# Patient Record
Sex: Female | Born: 1945
Health system: Southern US, Community
[De-identification: ages and names within clinical notes are randomized; demographics above are authoritative.]

## PROBLEM LIST (undated history)

## (undated) DIAGNOSIS — I10 Essential (primary) hypertension: Secondary | ICD-10-CM

## (undated) DIAGNOSIS — E119 Type 2 diabetes mellitus without complications: Secondary | ICD-10-CM

## (undated) HISTORY — PX: BRAIN TUMOR EXCISION: SHX577

## (undated) HISTORY — PX: ABDOMINAL HYSTERECTOMY: SHX81

---

## 2017-09-22 ENCOUNTER — Emergency Department (HOSPITAL_BASED_OUTPATIENT_CLINIC_OR_DEPARTMENT_OTHER): Payer: Medicare Other

## 2017-09-22 ENCOUNTER — Encounter (HOSPITAL_BASED_OUTPATIENT_CLINIC_OR_DEPARTMENT_OTHER): Payer: Self-pay

## 2017-09-22 ENCOUNTER — Emergency Department (HOSPITAL_BASED_OUTPATIENT_CLINIC_OR_DEPARTMENT_OTHER)
Admission: EM | Admit: 2017-09-22 | Discharge: 2017-09-22 | Disposition: A | Discharge status: Home / Self Care | Payer: Medicare Other | Attending: Physician Assistant | Admitting: Physician Assistant

## 2017-09-22 DIAGNOSIS — E119 Type 2 diabetes mellitus without complications: Secondary | ICD-10-CM | POA: Insufficient documentation

## 2017-09-22 DIAGNOSIS — Z79899 Other long term (current) drug therapy: Secondary | ICD-10-CM | POA: Diagnosis not present

## 2017-09-22 DIAGNOSIS — M25532 Pain in left wrist: Secondary | ICD-10-CM | POA: Insufficient documentation

## 2017-09-22 DIAGNOSIS — Z7982 Long term (current) use of aspirin: Secondary | ICD-10-CM | POA: Diagnosis not present

## 2017-09-22 DIAGNOSIS — I10 Essential (primary) hypertension: Secondary | ICD-10-CM | POA: Insufficient documentation

## 2017-09-22 HISTORY — DX: Essential (primary) hypertension: I10

## 2017-09-22 HISTORY — DX: Type 2 diabetes mellitus without complications: E11.9

## 2017-09-22 MED ORDER — IBUPROFEN 800 MG PO TABS
800.0000 mg | ORAL_TABLET | Freq: Once | ORAL | Status: AC
Start: 2017-09-22 — End: 2017-09-22
  Administered 2017-09-22: 800 mg via ORAL
  Filled 2017-09-22: qty 1

## 2017-09-22 MED ORDER — IBUPROFEN 600 MG PO TABS: 600.0000 mg | ORAL_TABLET | Freq: Four times a day (QID) | ORAL | 0 refills | Status: AC | PRN

## 2017-09-22 MED FILL — IBUPROFEN 600 MG TABLET: 600 | 8 days supply | Qty: 30 | Fill #0

## 2017-09-22 NOTE — ED Triage Notes (Signed)
Pt c/o lt wrist pain x2wks, denies injury, states worse today unable to hold anything

## 2017-09-22 NOTE — Discharge Instructions (Signed)
You were seen today for pain in your left wrist.  Your pain is most likely from arthritis.  You may also have some pain from carpal tunnel syndrome.  We do suggest that you follow-up with an orthopedic physician for further treatment and evaluation.  In the meantime we have given you a wrist splint.  At home take ibuprofen for pain.  Do not use this ibuprofen for longer than 2 weeks.  While you are taking ibuprofen, do not take aspirin.  You can continue Tylenol 3 if needed. Take care!

## 2017-09-22 NOTE — ED Provider Notes (Signed)
[facility] EMERGENCY DEPARTMENT Provider Note   CSN: [id number] Arrival date & time: 09/22/17  0857     History   Chief Complaint Chief Complaint  Patient presents with  . Wrist Pain    HPI Amy Baker is a 72 y.o. female with PMH of T2DM, HTN here for left wrist pain  HPI  Patient is here for left wrist pain for the last 2 weeks.  She denies any trauma to the wrist.  She does note that 2 weeks ago she was lifting a heavy pot of water and while she was lifting it she felt pain on the radial aspect of her left wrist.  She notes that since then the pain has been constant.  She notes that the pain is worse in the morning.  She has been taking her home Tylenol 3 that her PCP prescribes.  She is also been taking aspirin.  She notes that sweeping her floors also worsens her wrist pain.  She admits to some numbness and tingling of the first 3 digits of her left hand.  She does have a history of arthritis in several joints but has never had pain in her left wrist like this before.  Past Medical History:  Diagnosis Date  . Diabetes mellitus without complication (HCC)   . Hypertension     There are no active problems to display for this patient.   Past Surgical History:  Procedure Laterality Date  . ABDOMINAL HYSTERECTOMY    . BRAIN TUMOR EXCISION       OB History   None      Home Medications    Prior to Admission medications   Medication Sig Start Date End Date Taking? Authorizing Provider  acetaminophen-codeine (TYLENOL #3) 300-30 MG tablet Take by mouth every 4 (four) hours as needed for moderate pain.   Yes [provider]  amLODipine (NORVASC) 10 MG tablet Take 10 mg by mouth daily.   Yes [provider]  aspirin EC 81 MG tablet Take 81 mg by mouth daily.   Yes [provider]  atorvastatin (LIPITOR) 20 MG tablet Take 20 mg by mouth daily.   Yes [provider]  carvedilol (COREG) 25 MG tablet Take 25 mg by mouth 2 (two)  times daily with a meal.   Yes [provider]  losartan-hydrochlorothiazide (HYZAAR) 100-25 MG tablet Take 1 tablet by mouth daily.   Yes [provider]  venlafaxine (EFFEXOR) 75 MG tablet Take 75 mg by mouth 2 (two) times daily.   Yes [provider]  ibuprofen (ADVIL,MOTRIN) 600 MG tablet Take 1 tablet (600 mg total) by mouth every 6 (six) hours as needed. 09/22/17   Beaulah DinningGambino, Christina M, MD    Family History No family history on file.  Social History Social History   Tobacco Use  . Smoking status: Never Smoker  . Smokeless tobacco: Never Used  Substance Use Topics  . Alcohol use: Not Currently  . Drug use: Not Currently     Allergies   Patient has no known allergies.   Review of Systems Review of Systems  Musculoskeletal: Positive for arthralgias (Hip and wrist).  Neurological: Positive for numbness.  All other systems reviewed and are negative.    Physical Exam Updated Vital Signs BP (!) 146/105 (BP Location: Right Arm)   Pulse 65   Temp 97.9 F (36.6 C) (Oral)   Resp 18   Ht 5\' 3"  (1.6 m)   Wt 75.3 kg (166 lb)  SpO2 97%   BMI 29.41 kg/m   Physical Exam  Constitutional: She is oriented to person, place, and time. She appears well-developed and well-nourished.  HENT:  Head: Normocephalic.  Eyes: Pupils are equal, round, and reactive to light.  Neck: Normal range of motion.  Cardiovascular: Normal rate and regular rhythm.  Pulmonary/Chest: Effort normal and breath sounds normal.  Abdominal: Soft. Bowel sounds are normal.  Musculoskeletal:       Right wrist: She exhibits normal range of motion, no tenderness, no bony tenderness, no swelling and no deformity.  Left wrist: TTP noted at the 1st MCP joint and in the anatomical snuffbox. Inspection yielded no erythema, ecchymosis, bony deformity, or swelling. ROM full with good flexion and extension and ulnar/radial deviation that is symmetrical with opposite wrist. Does have pain with  ulnar devation. Palpation is normal over scaphoid, lunate, and TFCC; tendons without tenderness/swelling. Strength 5/5 in all directions. Positive tinel's and phalens.   Neurological: She is alert and oriented to person, place, and time.  Skin: Skin is warm and dry. Capillary refill takes less than 2 seconds.  Psychiatric: She has a normal mood and affect.     ED Treatments / Results  Labs (all labs ordered are listed, but only abnormal results are displayed) Labs Reviewed - No data to display  EKG None  Radiology Dg Wrist Complete Left  Result Date: 09/22/2017 CLINICAL DATA:  Pt c/o lt wrist pain x2wks, denies injury, states worse today unable to hold anything EXAM: LEFT WRIST - COMPLETE 3+ VIEW COMPARISON:  None. FINDINGS: There is no evidence of fracture or dislocation. There is no evidence of arthropathy or other focal bone abnormality. Soft tissues are unremarkable. IMPRESSION: Negative. Electronically Signed   By: Corlis Leak M.D.   On: 09/22/2017 09:58    Procedures Procedures (including critical care time)  Medications Ordered in ED Medications  ibuprofen (ADVIL,MOTRIN) tablet 800 mg (800 mg Oral Given 09/22/17 0955)     Initial Impression / Assessment and Plan / ED Course  I have reviewed the triage vital signs and the nursing notes.  Pertinent labs & imaging results that were available during my care of the patient were reviewed by me and considered in my medical decision making (see chart for details).     Patient here for left wrist pain.  Exam consistent with likely arthritis at the MCP joint of the first digit as well as some signs of carpal tunnel syndrome.  X-ray was performed of the left wrist, did not show any acute pathology or signs of fracture.  Patient was given 800 mg of ibuprofen and placed in a wrist splint.  She was instructed to follow-up with the orthopedic specialist.  She will take ibuprofen as needed for pain.  Discussed not to take her usual aspirin  with this.  Stable for discharge at this time.  Final Clinical Impressions(s) / ED Diagnoses   Final diagnoses:  Left wrist pain    ED Discharge Orders        Ordered    ibuprofen (ADVIL,MOTRIN) 600 MG tablet  Every 6 hours PRN     09/22/17 1025       Beaulah Dinning, MD 09/22/17 1111    Abelino Derrick, MD 09/22/17 1125

## 2018-11-19 ENCOUNTER — Other Ambulatory Visit: Payer: Self-pay

## 2018-11-19 DIAGNOSIS — Z20822 Contact with and (suspected) exposure to covid-19: Secondary | ICD-10-CM

## 2018-11-20 LAB — NOVEL CORONAVIRUS, NAA: SARS-CoV-2, NAA: NOT DETECTED

## 2018-11-22 ENCOUNTER — Telehealth: Payer: Self-pay | Admitting: Family Medicine

## 2018-11-22 NOTE — Telephone Encounter (Signed)
Pt aware covid lab test negative, not detected °

## 2019-10-19 ENCOUNTER — Other Ambulatory Visit: Payer: Self-pay

## 2019-10-19 ENCOUNTER — Emergency Department (HOSPITAL_BASED_OUTPATIENT_CLINIC_OR_DEPARTMENT_OTHER)
Admission: EM | Admit: 2019-10-19 | Discharge: 2019-10-19 | Disposition: A | Discharge status: Home / Self Care | Payer: Medicare Other | Attending: Emergency Medicine | Admitting: Emergency Medicine

## 2019-10-19 ENCOUNTER — Emergency Department (HOSPITAL_BASED_OUTPATIENT_CLINIC_OR_DEPARTMENT_OTHER): Payer: Medicare Other

## 2019-10-19 ENCOUNTER — Encounter (HOSPITAL_BASED_OUTPATIENT_CLINIC_OR_DEPARTMENT_OTHER): Payer: Self-pay | Admitting: *Deleted

## 2019-10-19 DIAGNOSIS — I10 Essential (primary) hypertension: Secondary | ICD-10-CM | POA: Diagnosis not present

## 2019-10-19 DIAGNOSIS — E119 Type 2 diabetes mellitus without complications: Secondary | ICD-10-CM | POA: Insufficient documentation

## 2019-10-19 DIAGNOSIS — Y999 Unspecified external cause status: Secondary | ICD-10-CM | POA: Insufficient documentation

## 2019-10-19 DIAGNOSIS — W2203XA Walked into furniture, initial encounter: Secondary | ICD-10-CM | POA: Diagnosis not present

## 2019-10-19 DIAGNOSIS — S90122A Contusion of left lesser toe(s) without damage to nail, initial encounter: Secondary | ICD-10-CM | POA: Diagnosis not present

## 2019-10-19 DIAGNOSIS — Y929 Unspecified place or not applicable: Secondary | ICD-10-CM | POA: Insufficient documentation

## 2019-10-19 DIAGNOSIS — Y9301 Activity, walking, marching and hiking: Secondary | ICD-10-CM | POA: Diagnosis not present

## 2019-10-19 DIAGNOSIS — Z79899 Other long term (current) drug therapy: Secondary | ICD-10-CM | POA: Insufficient documentation

## 2019-10-19 DIAGNOSIS — Z7982 Long term (current) use of aspirin: Secondary | ICD-10-CM | POA: Insufficient documentation

## 2019-10-19 DIAGNOSIS — Z7984 Long term (current) use of oral hypoglycemic drugs: Secondary | ICD-10-CM | POA: Insufficient documentation

## 2019-10-19 DIAGNOSIS — R52 Pain, unspecified: Secondary | ICD-10-CM

## 2019-10-19 DIAGNOSIS — S99922A Unspecified injury of left foot, initial encounter: Secondary | ICD-10-CM | POA: Diagnosis present
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
## 2019-10-19 NOTE — ED Triage Notes (Addendum)
Hit her fourth toe on the kitchen table on 7/17 and has been hurting since then.
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
## 2020-08-20 ENCOUNTER — Emergency Department (HOSPITAL_BASED_OUTPATIENT_CLINIC_OR_DEPARTMENT_OTHER)
Admission: EM | Admit: 2020-08-20 | Discharge: 2020-08-20 | Disposition: A | Discharge status: Home / Self Care | Payer: Medicare Other | Attending: Emergency Medicine | Admitting: Emergency Medicine

## 2020-08-20 ENCOUNTER — Other Ambulatory Visit: Payer: Self-pay

## 2020-08-20 DIAGNOSIS — U071 COVID-19: Secondary | ICD-10-CM | POA: Diagnosis not present

## 2020-08-20 DIAGNOSIS — I10 Essential (primary) hypertension: Secondary | ICD-10-CM | POA: Insufficient documentation

## 2020-08-20 DIAGNOSIS — Z79899 Other long term (current) drug therapy: Secondary | ICD-10-CM | POA: Insufficient documentation

## 2020-08-20 DIAGNOSIS — E119 Type 2 diabetes mellitus without complications: Secondary | ICD-10-CM | POA: Insufficient documentation

## 2020-08-20 DIAGNOSIS — R059 Cough, unspecified: Secondary | ICD-10-CM | POA: Diagnosis present

## 2020-08-20 DIAGNOSIS — Z7982 Long term (current) use of aspirin: Secondary | ICD-10-CM | POA: Diagnosis not present

## 2020-08-20 DIAGNOSIS — Z20822 Contact with and (suspected) exposure to covid-19: Secondary | ICD-10-CM

## 2020-08-20 LAB — SARS CORONAVIRUS 2 BY RT PCR (HOSPITAL ORDER, PERFORMED IN ~~LOC~~ HOSPITAL LAB): SARS Coronavirus 2: POSITIVE — AB

## 2020-08-20 NOTE — ED Provider Notes (Signed)
[facility] EMERGENCY DEPARTMENT Provider Note   CSN: [id number] Arrival date & time: 08/20/20  9518     History Chief Complaint  Patient presents with  . Flu Vaccine    Requesting COVID test     Amy Baker is a 75 y.o. female.  She is here with a mild cough.  Her husband tested positive for COVID.  She is vaccinated.  She has not had any fever nausea vomiting diarrhea.  The history is provided by the patient.  Cough Cough characteristics:  Non-productive Sputum characteristics:  Nondescript Severity:  Mild Onset quality:  Gradual Duration:  1 day Timing:  Sporadic Progression:  Unchanged Chronicity:  New Smoker: no   Context: sick contacts   Relieved by:  None tried Worsened by:  Nothing Ineffective treatments:  None tried Associated symptoms: no chest pain, no fever, no headaches, no rash, no shortness of breath and no sore throat        Past Medical History:  Diagnosis Date  . Diabetes mellitus without complication (HCC)   . Hypertension     There are no problems to display for this patient.   Past Surgical History:  Procedure Laterality Date  . ABDOMINAL HYSTERECTOMY    . BRAIN TUMOR EXCISION       OB History    Gravida  4   Para  3   Term      Preterm      AB  1   Living        SAB      IAB      Ectopic      Multiple      Live Births              No family history on file.  Social History   Tobacco Use  . Smoking status: Never Smoker  . Smokeless tobacco: Never Used  Substance Use Topics  . Alcohol use: Not Currently  . Drug use: Not Currently    Home Medications Prior to Admission medications   Medication Sig Start Date End Date Taking? Authorizing Provider  acetaminophen-codeine (TYLENOL #3) 300-30 MG tablet Take by mouth every 4 (four) hours as needed for moderate pain.    [provider]  amLODipine (NORVASC) 10 MG tablet Take 10 mg by mouth daily.    [provider]  aspirin EC  81 MG tablet Take 81 mg by mouth daily.    [provider]  atorvastatin (LIPITOR) 20 MG tablet Take 20 mg by mouth daily.    [provider]  carvedilol (COREG) 25 MG tablet Take 25 mg by mouth 2 (two) times daily with a meal.    [provider]  gabapentin (NEURONTIN) 300 MG capsule Take 300 mg by mouth 2 (two) times daily.    [provider]  ibuprofen (ADVIL,MOTRIN) 600 MG tablet Take 1 tablet (600 mg total) by mouth every 6 (six) hours as needed. 09/22/17   Beaulah Dinning, MD  losartan-hydrochlorothiazide (HYZAAR) 100-25 MG tablet Take 1 tablet by mouth daily.    [provider]  venlafaxine (EFFEXOR) 75 MG tablet Take 75 mg by mouth 2 (two) times daily.    [provider]    Allergies    Patient has no known allergies.  Review of Systems   Review of Systems  Constitutional: Negative for fever.  HENT: Negative for sore throat.   Eyes: Negative for visual disturbance.  Respiratory: Positive for cough. Negative for shortness of  breath.   Cardiovascular: Negative for chest pain.  Gastrointestinal: Negative for abdominal pain.  Genitourinary: Negative for dysuria.  Musculoskeletal: Negative for neck pain.  Skin: Negative for rash.  Neurological: Negative for headaches.    Physical Exam Updated Vital Signs BP (!) 148/76 (BP Location: Right Arm)   Pulse 70   Temp (!) 97.5 F (36.4 C) (Oral)   Resp 18   Ht 5\' 3"  (1.6 m)   Wt 76.2 kg   SpO2 100%   BMI 29.76 kg/m   Physical Exam Vitals and nursing note reviewed.  Constitutional:      General: She is not in acute distress.    Appearance: She is well-developed.  HENT:     Head: Normocephalic and atraumatic.  Eyes:     Conjunctiva/sclera: Conjunctivae normal.  Cardiovascular:     Rate and Rhythm: Normal rate and regular rhythm.     Heart sounds: No murmur heard.   Pulmonary:     Effort: Pulmonary effort is normal. No respiratory distress.     Breath sounds:  Normal breath sounds.  Abdominal:     Palpations: Abdomen is soft.     Tenderness: There is no abdominal tenderness.  Musculoskeletal:        General: Normal range of motion.     Cervical back: Neck supple.     Right lower leg: No edema.     Left lower leg: No edema.  Skin:    General: Skin is warm and dry.  Neurological:     General: No focal deficit present.     Mental Status: She is alert.     ED Results / Procedures / Treatments   Labs (all labs ordered are listed, but only abnormal results are displayed) Labs Reviewed  SARS CORONAVIRUS 2 (HOSPITAL ORDER, PERFORMED IN [facility]) - Abnormal; Notable for the following components:      Result Value   SARS Coronavirus 2 POSITIVE (*)    All other components within normal limits    EKG None  Radiology No results found.  Procedures Procedures   Medications Ordered in ED Medications - No data to display  ED Course  I have reviewed the triage vital signs and the nursing notes.  Pertinent labs & imaging results that were available during my care of the patient were reviewed by me and considered in my medical decision making (see chart for details).    MDM Rules/Calculators/A&P                         Amy Baker was evaluated in Emergency Department on 08/20/2020 for the symptoms described in the history of present illness. She was evaluated in the context of the global COVID-19 pandemic, which necessitated consideration that the patient might be at risk for infection with the SARS-CoV-2 virus that causes COVID-19. Institutional protocols and algorithms that pertain to the evaluation of patients at risk for COVID-19 are in a state of rapid change based on information released by regulatory bodies including the CDC and federal and state organizations. These policies and algorithms were followed during the patient's care in the ED.   Final Clinical Impression(s) / ED Diagnoses Final diagnoses:  Person  under investigation for COVID-19    Rx / DC Orders ED Discharge Orders    None       08/22/2020, MD 08/20/20 [phone]

## 2020-08-20 NOTE — ED Notes (Signed)
Phone call made to client in regards to the Covid Swab obtained on her visit today, informed client she was Covid Positive , also was told that both grandchildren revealed positive results as well on both their Covid swabs. Opportunity for questions provided.

## 2020-08-20 NOTE — ED Triage Notes (Signed)
Pt requesting COVID test because spouse and daughter  tested +  No medical complaints at this time

## 2020-08-20 NOTE — Discharge Instructions (Addendum)
You were tested for COVID.  This result should come back in the next few hours.  You can see the result in MyChart.  Use and isolate until your test is back.  If you test positive you will need to isolate for at least 5 days.  Longer if you are having symptoms or fevers.  If you test positive you should contact her daughter because she may be eligible for a medication to treat the virus.

## 2020-08-21 ENCOUNTER — Telehealth: Payer: Self-pay

## 2020-08-21 NOTE — Telephone Encounter (Signed)
Called to discuss with patient about COVID-19 symptoms and the use of one of the available treatments for those with mild to moderate Covid symptoms and at a high risk of hospitalization.  Pt appears to qualify for outpatient treatment due to co-morbid conditions and/or a member of an at-risk group in accordance with the FDA Emergency Use Authorization.    Symptom onset: 08/20/20 cough Vaccinated: Yes Booster? Yes Immunocompromised? No Qualifiers: DM,htn NIH Criteria: Tier 3 Declines treatment.  Esther Hardy

## 2020-10-25 IMAGING — DX DG FOOT COMPLETE 3+V*L*
3 series · 3 of 3 positions shown · non-contrast
Comparison: None.

CLINICAL DATA: Foot pain.

EXAM:
LEFT FOOT - COMPLETE 3+ VIEW

[foot ap]
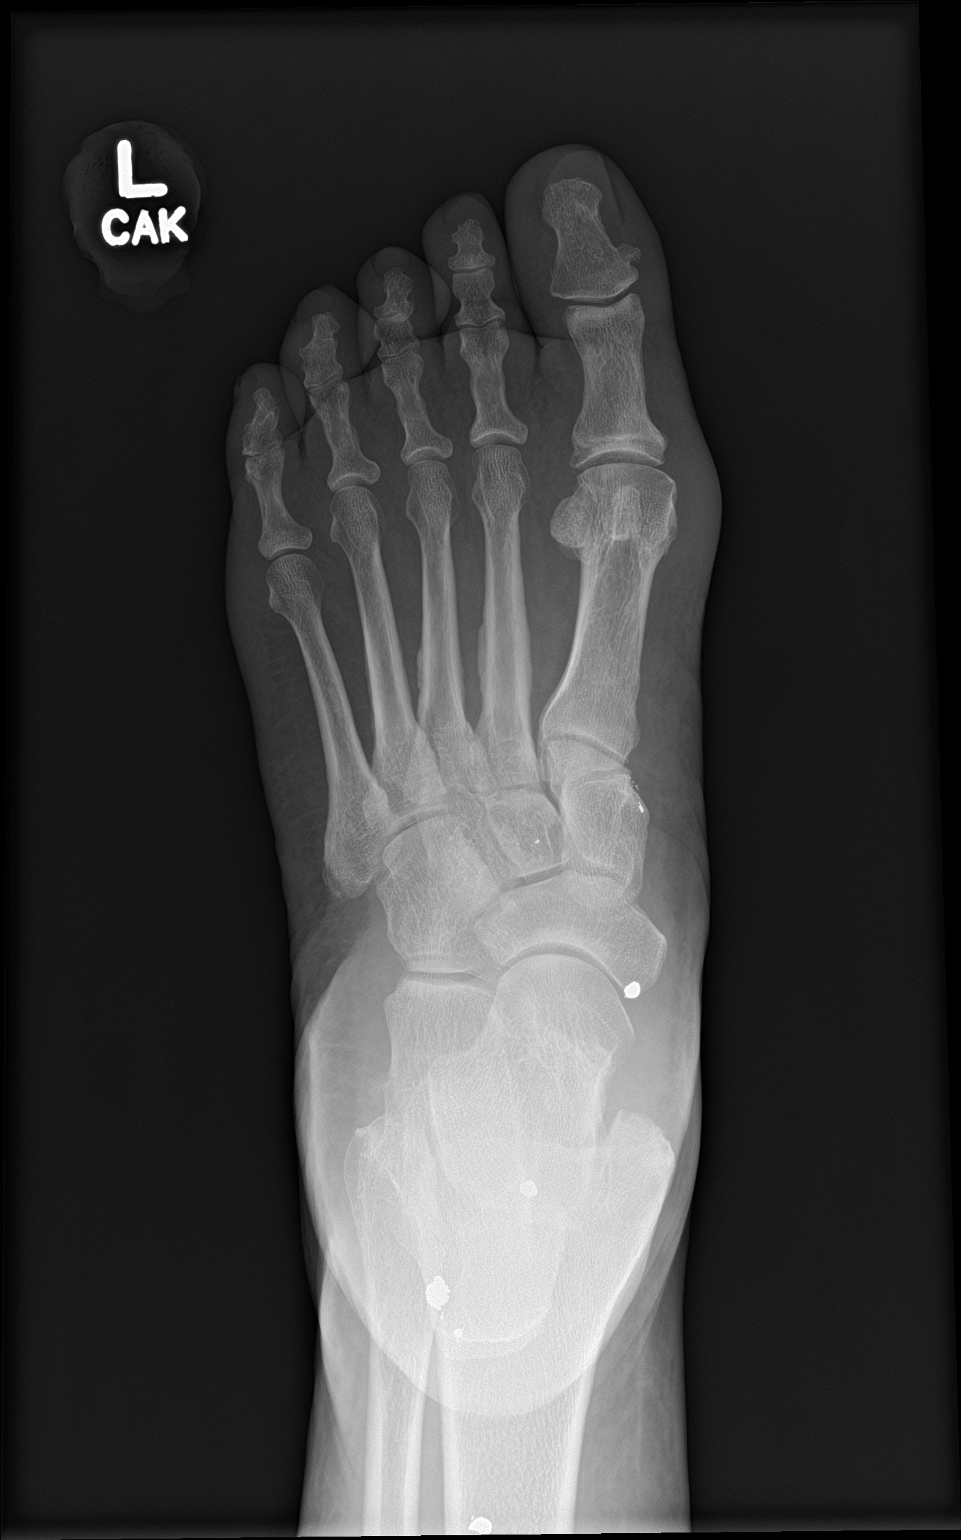

[foot obl]
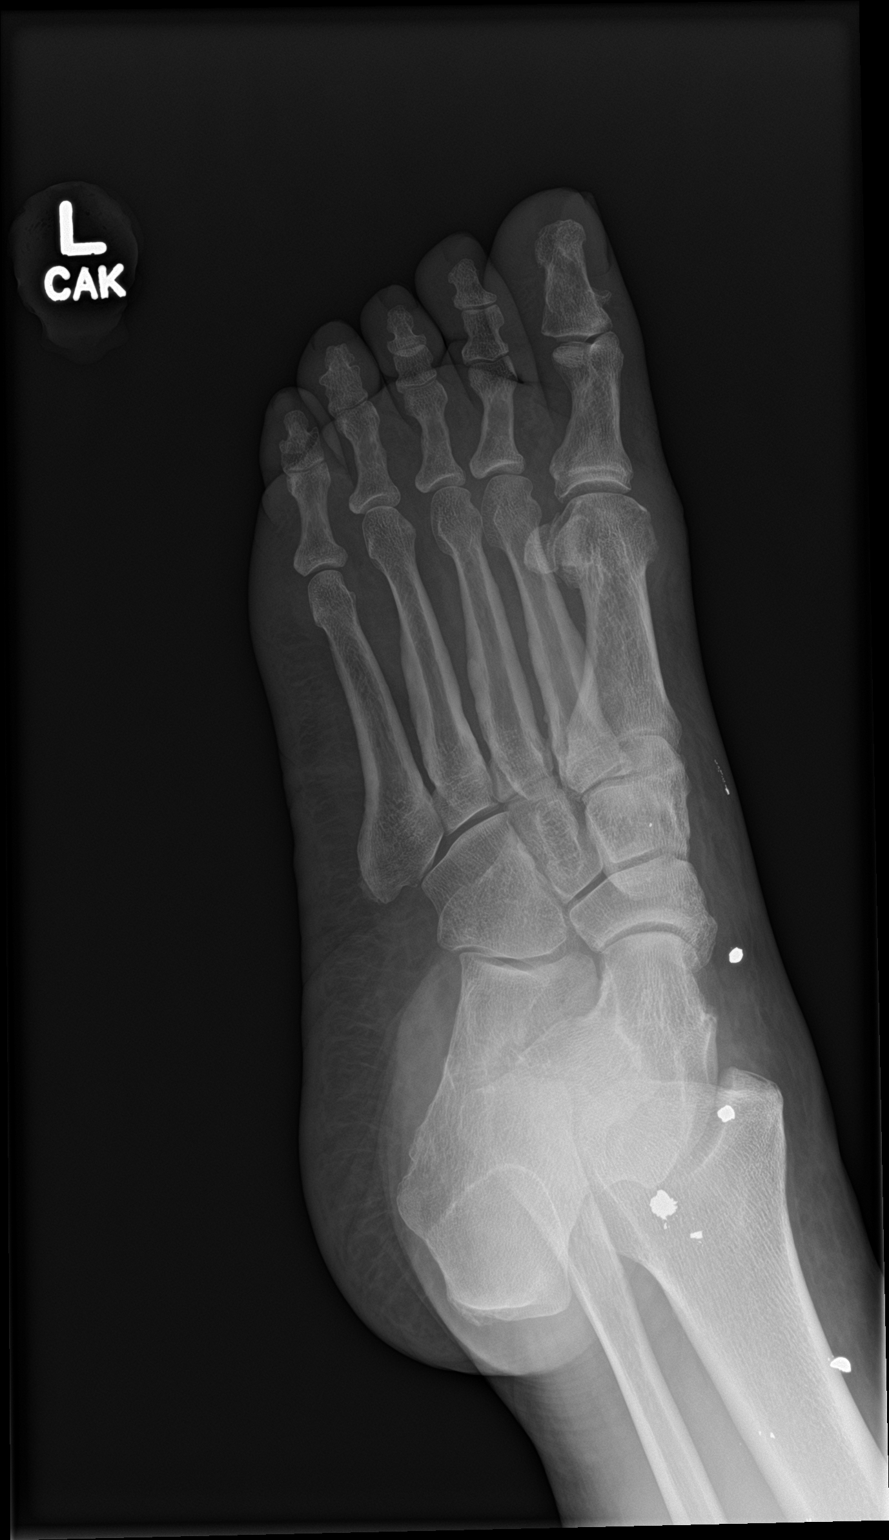

[foot lat]
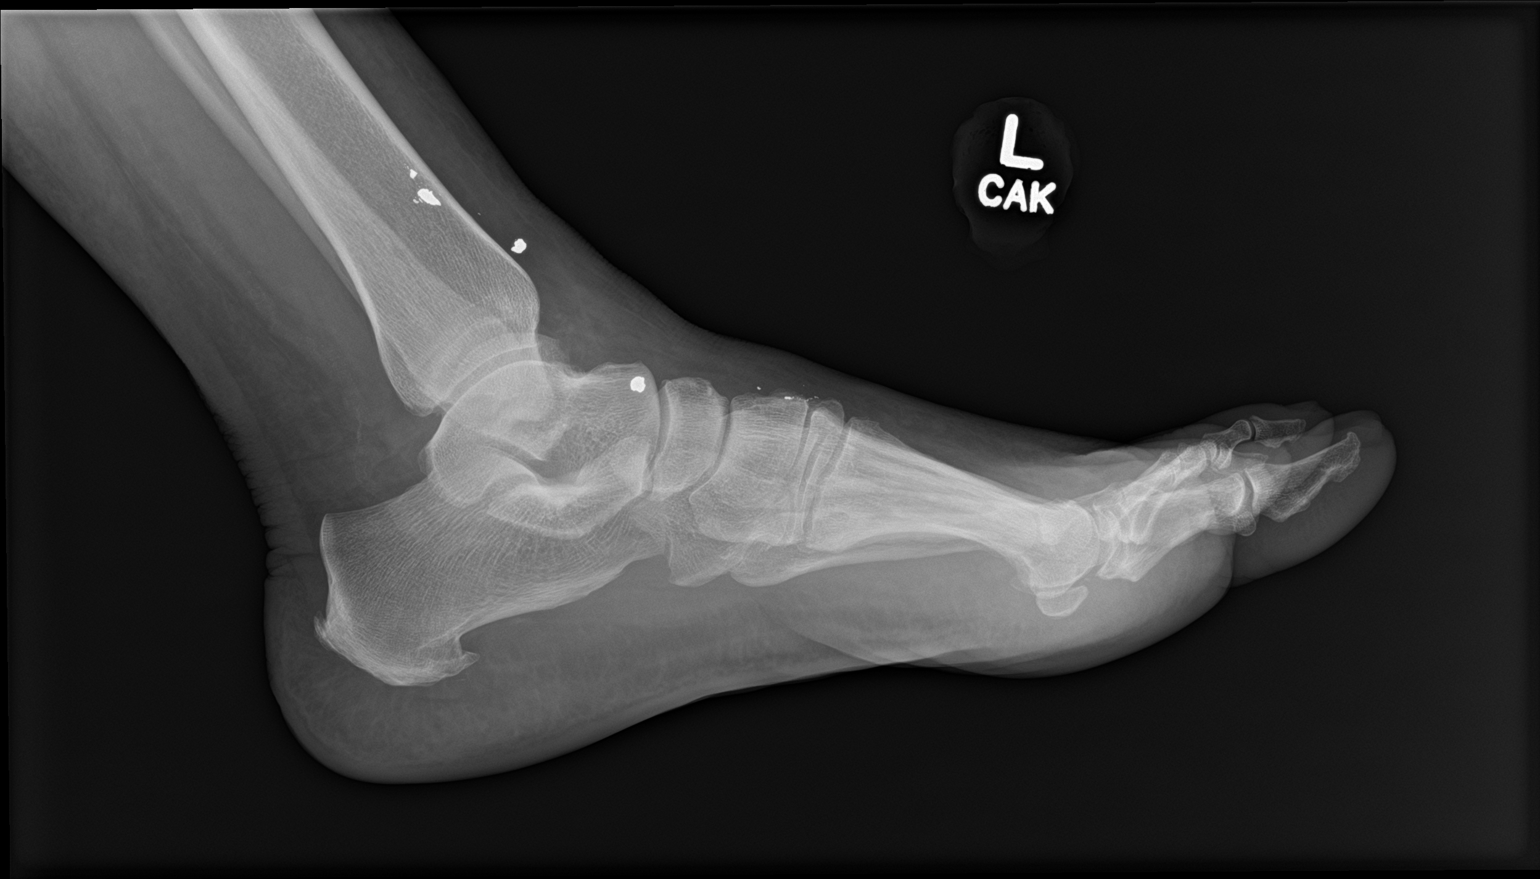

[3 of 3 positions shown; findings below may reference images not displayed]

FINDINGS: Normal alignment with approximation of the joints. No fracture or
focal osseous lesion. Small dorsal and plantar calcaneal
enthesophytes. Scattered hyperdense foreign bodies overlie the
anterior ankle and dorsal foot. Soft tissues are otherwise
unremarkable.
IMPRESSION: No acute osseous abnormality.

Scattered anterior ankle and dorsal foot radiodense foreign bodies.

## 2020-12-24 ENCOUNTER — Ambulatory Visit (INDEPENDENT_AMBULATORY_CARE_PROVIDER_SITE_OTHER): Payer: Medicare Other | Admitting: Family Medicine

## 2020-12-24 VITALS — Ht 63.0 in | Wt 163.0 lb

## 2020-12-24 DIAGNOSIS — M25562 Pain in left knee: Secondary | ICD-10-CM

## 2020-12-24 MED ORDER — METHYLPREDNISOLONE ACETATE 40 MG/ML IJ SUSP
40.0000 mg | Freq: Once | INTRAMUSCULAR | Status: AC
  Administered 2020-12-24: 40 mg via INTRA_ARTICULAR

## 2020-12-24 NOTE — Patient Instructions (Signed)
Your knee pain and swelling are due to a flare of arthritis vs a degenerative meniscus tear. Both are treated similarly. These are the different medications you can take for this: Tylenol 500mg  1-2 tabs three times a day for pain. Capsaicin, aspercreme, or biofreeze topically up to four times a day may also help with pain. Some supplements that may help for arthritis: Boswellia extract, curcumin, pycnogenol Cortisone injections are an option - you were given this today. If cortisone injections do not help, there are different types of shots that may help but they take longer to take effect. It's important that you continue to stay active. Straight leg raises, knee extensions 3 sets of 10 once a day (add ankle weight if these become too easy). Consider physical therapy to strengthen muscles around the joint that hurts to take pressure off of the joint itself. Shoe inserts with good arch support may be helpful. Heat or ice 15 minutes at a time 3-4 times a day as needed to help with pain. Water aerobics and cycling with low resistance are the best two types of exercise for arthritis though any exercise is ok as long as it doesn't worsen the pain. Follow up with me in 1 month.

## 2020-12-25 ENCOUNTER — Encounter: Payer: Self-pay | Admitting: Family Medicine

## 2020-12-25 NOTE — Progress Notes (Signed)
PCP: Clide Dales, PA  Subjective:   HPI: Patient is a 75 y.o. female here for left knee pain.  Patient here with daughter. They report she's had worsening anterior left knee pain the past 3 weeks. She did fall a couple times because her left knee gave out on her. No injuries or problems with the knee prior to this. + swelling. Pain more medial than lateral.  Past Medical History:  Diagnosis Date   Diabetes mellitus without complication (HCC)    Hypertension     Current Outpatient Medications on File Prior to Visit  Medication Sig Dispense Refill   acetaminophen-codeine (TYLENOL #3) 300-30 MG tablet Take by mouth every 4 (four) hours as needed for moderate pain.     amLODipine (NORVASC) 10 MG tablet Take 10 mg by mouth daily.     aspirin EC 81 MG tablet Take 81 mg by mouth daily.     atorvastatin (LIPITOR) 20 MG tablet Take 20 mg by mouth daily.     carvedilol (COREG) 25 MG tablet Take 25 mg by mouth 2 (two) times daily with a meal.     gabapentin (NEURONTIN) 300 MG capsule Take 300 mg by mouth 2 (two) times daily.     ibuprofen (ADVIL,MOTRIN) 600 MG tablet Take 1 tablet (600 mg total) by mouth every 6 (six) hours as needed. 30 tablet 0   losartan-hydrochlorothiazide (HYZAAR) 100-25 MG tablet Take 1 tablet by mouth daily.     venlafaxine (EFFEXOR) 75 MG tablet Take 75 mg by mouth 2 (two) times daily.     No current facility-administered medications on file prior to visit.    Past Surgical History:  Procedure Laterality Date   ABDOMINAL HYSTERECTOMY     BRAIN TUMOR EXCISION      No Known Allergies  Ht 5\' 3"  (1.6 m)   Wt 163 lb (73.9 kg)   BMI 28.87 kg/m   No flowsheet data found.  No flowsheet data found.      Objective:  Physical Exam:  Gen: NAD, comfortable in exam room  Left knee: Mild effusion.  No other gross deformity, ecchymoses. TTP medial and lateral joint lines.  No post patellar facet tenderness. FROM with normal strength. Negative  ant/post drawers. Negative valgus/varus testing. Negative lachman.  Mild pain with mcmurrays, negative apleys. NV intact distally.   Assessment & Plan:  1. Left knee pain - 2/2 arthritis vs degenerative meniscus tear.  Home exercises reviewed.  Tylenol, topical medications.  Injection given today.  Heat or ice.  F/u in 1 month.  After informed written consent timeout was performed, patient was lying supine on exam table. Left knee was prepped with alcohol swab and utilizing superolateral approach with ultrasound guidance, patient's left knee was injected intraarticularly with 3:1 lidocaine: depomedrol. Patient tolerated the procedure well without immediate complications.

## 2021-01-23 ENCOUNTER — Ambulatory Visit: Payer: Medicare Other | Admitting: Family Medicine

## 2021-02-06 ENCOUNTER — Encounter: Payer: Self-pay | Admitting: Family Medicine

## 2021-02-06 ENCOUNTER — Ambulatory Visit (INDEPENDENT_AMBULATORY_CARE_PROVIDER_SITE_OTHER): Payer: Medicare Other | Admitting: Family Medicine

## 2021-02-06 VITALS — BP 110/68 | Ht 63.0 in | Wt 164.0 lb

## 2021-02-06 DIAGNOSIS — M25562 Pain in left knee: Secondary | ICD-10-CM | POA: Diagnosis not present

## 2021-02-06 NOTE — Progress Notes (Signed)
PCP: Clide Dales, PA  Subjective:   HPI: Patient is a 75 y.o. female here for left knee pain.  9/26: Patient here with daughter. They report she's had worsening anterior left knee pain the past 3 weeks. She did fall a couple times because her left knee gave out on her. No injuries or problems with the knee prior to this. + swelling. Pain more medial than lateral.  11/9: Patient reports she's doing much better. Some aching medial knee at times but not limiting her. Not taking anything for this. Doing home exercises.  Past Medical History:  Diagnosis Date   Diabetes mellitus without complication (HCC)    Hypertension     Current Outpatient Medications on File Prior to Visit  Medication Sig Dispense Refill   acetaminophen-codeine (TYLENOL #3) 300-30 MG tablet Take by mouth every 4 (four) hours as needed for moderate pain.     amLODipine (NORVASC) 10 MG tablet Take 10 mg by mouth daily.     aspirin EC 81 MG tablet Take 81 mg by mouth daily.     atorvastatin (LIPITOR) 20 MG tablet Take 20 mg by mouth daily.     carvedilol (COREG) 25 MG tablet Take 25 mg by mouth 2 (two) times daily with a meal.     gabapentin (NEURONTIN) 300 MG capsule Take 300 mg by mouth 2 (two) times daily.     ibuprofen (ADVIL,MOTRIN) 600 MG tablet Take 1 tablet (600 mg total) by mouth every 6 (six) hours as needed. 30 tablet 0   losartan-hydrochlorothiazide (HYZAAR) 100-25 MG tablet Take 1 tablet by mouth daily.     venlafaxine (EFFEXOR) 75 MG tablet Take 75 mg by mouth 2 (two) times daily.     No current facility-administered medications on file prior to visit.    Past Surgical History:  Procedure Laterality Date   ABDOMINAL HYSTERECTOMY     BRAIN TUMOR EXCISION      No Known Allergies  BP 110/68   Ht 5\' 3"  (1.6 m)   Wt 164 lb (74.4 kg)   BMI 29.05 kg/m   [facility] 02/06/2021  Frequency of aerobic exercise (# of days/week) 2  Average time in minutes 10   Frequency of strengthening activities (# of days/week) 0    No flowsheet data found.      Objective:  Physical Exam:  Gen: NAD, comfortable in exam room  Left knee: No effusion, gross deformity, ecchymoses. No TTP. FROM with normal strength. Negative ant/post drawers. Negative valgus/varus testing. Negative lachman.  Negative mcmurrays, apleys. NV intact distally.   Assessment & Plan:  1. Left knee pain - 2/2 arthritis vs degenerative meniscus tear.  Much improved with injection, home exercises.  Continue home exercises for 6 more weeks.  Tylenol, topical medications if needed.  F/u prn.
